# Patient Record
Sex: Male | Born: 1950 | Race: Black or African American | Hispanic: No | Marital: Married | Smoking: Never smoker
Health system: Southern US, Community
[De-identification: ages and names within clinical notes are randomized; demographics above are authoritative.]

---

## 2019-07-30 ENCOUNTER — Emergency Department (HOSPITAL_COMMUNITY)
Admission: EM | Admit: 2019-07-30 | Discharge: 2019-07-31 | Disposition: A | Discharge status: Home / Self Care | Payer: Medicare Other | Attending: Emergency Medicine | Admitting: Emergency Medicine

## 2019-07-30 ENCOUNTER — Encounter (HOSPITAL_COMMUNITY): Payer: Self-pay | Admitting: Emergency Medicine

## 2019-07-30 ENCOUNTER — Other Ambulatory Visit: Payer: Self-pay

## 2019-07-30 DIAGNOSIS — S0990XA Unspecified injury of head, initial encounter: Secondary | ICD-10-CM | POA: Insufficient documentation

## 2019-07-30 DIAGNOSIS — Y998 Other external cause status: Secondary | ICD-10-CM | POA: Diagnosis not present

## 2019-07-30 DIAGNOSIS — Y929 Unspecified place or not applicable: Secondary | ICD-10-CM | POA: Diagnosis not present

## 2019-07-30 DIAGNOSIS — R220 Localized swelling, mass and lump, head: Secondary | ICD-10-CM | POA: Insufficient documentation

## 2019-07-30 DIAGNOSIS — M50323 Other cervical disc degeneration at C6-C7 level: Secondary | ICD-10-CM | POA: Diagnosis not present

## 2019-07-30 DIAGNOSIS — Z0471 Encounter for examination and observation following alleged adult physical abuse: Secondary | ICD-10-CM | POA: Diagnosis present

## 2019-07-30 DIAGNOSIS — R030 Elevated blood-pressure reading, without diagnosis of hypertension: Secondary | ICD-10-CM | POA: Insufficient documentation

## 2019-07-30 DIAGNOSIS — Y9389 Activity, other specified: Secondary | ICD-10-CM | POA: Insufficient documentation
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
## 2019-07-31 ENCOUNTER — Emergency Department (HOSPITAL_COMMUNITY): Payer: Medicare Other
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
## 2019-07-31 NOTE — ED Notes (Signed)
Lab work sent to main lab prior to discontinuation.

## 2019-07-31 NOTE — ED Notes (Signed)
Patient transported to CT 
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
